# Patient Record
Sex: Female | Born: 1975 | Race: White | Hispanic: No | Marital: Married | Smoking: Never smoker
Health system: Southern US, Community
[De-identification: ages and names within clinical notes are randomized; demographics above are authoritative.]

## PROBLEM LIST (undated history)

## (undated) DIAGNOSIS — N1330 Unspecified hydronephrosis: Secondary | ICD-10-CM

## (undated) DIAGNOSIS — Q628 Other congenital malformations of ureter: Secondary | ICD-10-CM

## (undated) DIAGNOSIS — G459 Transient cerebral ischemic attack, unspecified: Secondary | ICD-10-CM

## (undated) HISTORY — PX: CHOLECYSTECTOMY: SHX55

## (undated) HISTORY — PX: TONSILLECTOMY: SUR1361

## (undated) HISTORY — PX: ABDOMINAL HYSTERECTOMY: SHX81

---

## 2013-06-15 DIAGNOSIS — G43909 Migraine, unspecified, not intractable, without status migrainosus: Secondary | ICD-10-CM | POA: Insufficient documentation

## 2013-06-15 DIAGNOSIS — F319 Bipolar disorder, unspecified: Secondary | ICD-10-CM | POA: Insufficient documentation

## 2013-10-06 DIAGNOSIS — E039 Hypothyroidism, unspecified: Secondary | ICD-10-CM | POA: Insufficient documentation

## 2015-02-16 DIAGNOSIS — Q628 Other congenital malformations of ureter: Secondary | ICD-10-CM | POA: Insufficient documentation

## 2015-08-07 ENCOUNTER — Encounter (HOSPITAL_BASED_OUTPATIENT_CLINIC_OR_DEPARTMENT_OTHER): Payer: Self-pay | Admitting: Emergency Medicine

## 2015-08-07 ENCOUNTER — Emergency Department (HOSPITAL_BASED_OUTPATIENT_CLINIC_OR_DEPARTMENT_OTHER)
Admission: EM | Admit: 2015-08-07 | Discharge: 2015-08-07 | Disposition: A | Discharge status: Home / Self Care | Payer: Self-pay | Attending: Physician Assistant | Admitting: Physician Assistant

## 2015-08-07 DIAGNOSIS — Z79899 Other long term (current) drug therapy: Secondary | ICD-10-CM | POA: Insufficient documentation

## 2015-08-07 DIAGNOSIS — M62838 Other muscle spasm: Secondary | ICD-10-CM | POA: Insufficient documentation

## 2015-08-07 MED ORDER — IBUPROFEN 800 MG PO TABS: 800.0000 mg | ORAL_TABLET | Freq: Three times a day (TID) | ORAL | Status: AC

## 2015-08-07 MED ORDER — METHOCARBAMOL 500 MG PO TABS: 500.0000 mg | ORAL_TABLET | Freq: Two times a day (BID) | ORAL | Status: DC

## 2015-08-07 NOTE — Discharge Instructions (Signed)
Take the prescribed medication as directed.  May continue to use heat therapy (heating pad, warm showers, hot bath, etc). Follow-up with your primary care doctor. Return to the ED for new or worsening symptoms.

## 2015-08-07 NOTE — ED Provider Notes (Signed)
CSN: [id number]     Arrival date & time 08/07/15  1939 History   First MD Initiated Contact with Patient 08/07/15 2210     Chief Complaint  Patient presents with  . Neck Pain     (Consider location/radiation/quality/duration/timing/severity/associated sxs/prior Treatment) Patient is a 40 y.o. female presenting with neck pain. The history is provided by the patient and medical records.  Neck Pain   40 year old female with no significant past medical history presenting to the ED for neck pain. Patient states she recently flew back from [place] on Friday. She states she sat in the middle seat and the next day after returning home she began to experience pain to her posterior neck and left shoulder. Patient reports this pain is a constant ache. She states sometimes when picking up things or pushing on something with her arms she has a sharp pain in the left side of her neck. She states she sat in the hot tub for while to try to alleviate soreness and she is also been taking Motrin but has not had any relief. She denies any numbness or weakness of her upper extremities. She denies any headache or dizziness. No prior neck injuries or surgeries.  History reviewed. No pertinent past medical history. Past Surgical History  Procedure Laterality Date  . Abdominal hysterectomy    . Tonsillectomy     History reviewed. No pertinent family history. Social History  Substance Use Topics  . Smoking status: Never Smoker   . Smokeless tobacco: None  . Alcohol Use: No   OB History    No data available     Review of Systems  Musculoskeletal: Positive for neck pain.  All other systems reviewed and are negative.     Allergies  Review of patient's allergies indicates not on file.  Home Medications   Prior to Admission medications   Medication Sig Start Date End Date Taking? Authorizing Provider  lamoTRIgine (LAMICTAL) 200 MG tablet Take 200 mg by mouth daily.   Yes Historical Provider, MD    norethindrone-ethinyl estradiol-iron (ESTROSTEP FE,TILIA FE,TRI-LEGEST FE) 1-20/1-30/1-35 MG-MCG tablet Take 1 tablet by mouth daily.   Yes Historical Provider, MD   BP 109/70 mmHg  Pulse 67  Temp(Src) 98.3 F (36.8 C) (Oral)  Resp 18  Ht 5\' 7"  (1.702 m)  Wt 74.844 kg  BMI 25.84 kg/m2  SpO2 100%   Physical Exam  Constitutional: She is oriented to person, place, and time. She appears well-developed and well-nourished. No distress.  HENT:  Head: Normocephalic and atraumatic.  Mouth/Throat: Oropharynx is clear and moist.  Eyes: Conjunctivae and EOM are normal. Pupils are equal, round, and reactive to light.  Neck: Normal range of motion. Neck supple.  Cardiovascular: Normal rate, regular rhythm and normal heart sounds.   Pulmonary/Chest: Effort normal and breath sounds normal. No respiratory distress. She has no wheezes.  Musculoskeletal: Normal range of motion. She exhibits no edema.       Cervical back: She exhibits tenderness and pain.       Back:  Tenderness of left cervical paraspinal region, no midline tenderness or step-off, full range of motion maintained, normal strength and sensation of bilateral upper extremities  Neurological: She is alert and oriented to person, place, and time.  AAOx3, answering questions appropriately; equal strength UE and LE bilaterally; CN grossly intact; moves all extremities appropriately without ataxia; no focal neuro deficits or facial asymmetry appreciated  Skin: Skin is warm and dry. She is not diaphoretic.  Psychiatric: She has  a normal mood and affect.  Nursing note and vitals reviewed.   ED Course  Procedures (including critical care time) Labs Review Labs Reviewed - No data to display  Imaging Review No results found. I have personally reviewed and evaluated these images and lab results as part of my medical decision-making.   EKG Interpretation None      MDM   Final diagnoses:  Muscle spasms of neck   40 year old female  here with sided neck pain after flying back from [place] last week. Patient afebrile, nontoxic in appearance. She denies any known injury or trauma. On exam she has tenderness of her left cervical paraspinal region without midline tenderness or step-off. Chest full range of motion of her neck. Normal strength and sensation of bilateral upper extremities.  No focal neurologic deficits to suggest central cord syndrome at this time. Patient likely with muscle spasms.  Will start on robaxin, continue motrin.  May also continue heat therapy at home.  Discussed plan with patient, he/she acknowledged understanding and agreed with plan of care.  Return precautions given for new or worsening symptoms.  Garlon Hatchet, PA-C 08/07/15 2252  Courteney Randall An, MD 08/09/15 [phone]

## 2015-08-07 NOTE — ED Notes (Signed)
Patient states that she just flew back from [place] on Friday, Since she has had pain to her neck, and shoulders. The patient reports that it like a constant ache that she can not get to go away and reports that it is annoying

## 2015-08-15 ENCOUNTER — Encounter (HOSPITAL_BASED_OUTPATIENT_CLINIC_OR_DEPARTMENT_OTHER): Payer: Self-pay | Admitting: Emergency Medicine

## 2015-08-15 DIAGNOSIS — M542 Cervicalgia: Secondary | ICD-10-CM | POA: Diagnosis present

## 2015-08-15 DIAGNOSIS — Z793 Long term (current) use of hormonal contraceptives: Secondary | ICD-10-CM | POA: Insufficient documentation

## 2015-08-15 DIAGNOSIS — Z791 Long term (current) use of non-steroidal anti-inflammatories (NSAID): Secondary | ICD-10-CM | POA: Diagnosis not present

## 2015-08-15 DIAGNOSIS — M5412 Radiculopathy, cervical region: Secondary | ICD-10-CM | POA: Insufficient documentation

## 2015-08-15 DIAGNOSIS — Z79899 Other long term (current) drug therapy: Secondary | ICD-10-CM | POA: Diagnosis not present

## 2015-08-15 NOTE — ED Notes (Signed)
Patient was here on the 21st with neck pain. The patient reports that none of the medications that she was given has helped and now her left 2 fingers are going numb

## 2015-08-16 ENCOUNTER — Emergency Department (HOSPITAL_BASED_OUTPATIENT_CLINIC_OR_DEPARTMENT_OTHER)
Admission: EM | Admit: 2015-08-16 | Discharge: 2015-08-16 | Disposition: A | Discharge status: Home / Self Care | Payer: Medicaid - Out of State | Attending: Emergency Medicine | Admitting: Emergency Medicine

## 2015-08-16 DIAGNOSIS — M5412 Radiculopathy, cervical region: Secondary | ICD-10-CM

## 2015-08-16 MED ORDER — PREDNISONE 20 MG PO TABS: ORAL_TABLET | ORAL | Status: DC

## 2015-08-16 MED ORDER — HYDROCODONE-ACETAMINOPHEN 5-325 MG PO TABS: 1.0000 | ORAL_TABLET | ORAL | Status: DC | PRN

## 2015-08-16 MED ORDER — DEXAMETHASONE SODIUM PHOSPHATE 10 MG/ML IJ SOLN
10.0000 mg | Freq: Once | INTRAMUSCULAR | Status: AC
  Administered 2015-08-16: 10 mg via INTRAMUSCULAR
  Filled 2015-08-16: qty 1

## 2015-08-16 NOTE — ED Provider Notes (Signed)
CSN: [id number]     Arrival date & time 08/15/15  2325 History   First MD Initiated Contact with Patient 08/16/15 0044     Chief Complaint  Patient presents with  . Neck Pain     (Consider location/radiation/quality/duration/timing/severity/associated sxs/prior Treatment) HPI Comments: Presents with continued neck pain. Patient seen in the ER previously and treated with ibuprofen and muscle relaxer. Patient reports that pain is continuing and now worsening. Pain is on the left side of her neck and now radiates down the left arm and she has had tingling in her third fourth and fifth fingers. No loss of strength or sensation. She denies any injury.  Patient is a 40 y.o. female presenting with neck pain.  Neck Pain Associated symptoms: numbness     History reviewed. No pertinent past medical history. Past Surgical History  Procedure Laterality Date  . Abdominal hysterectomy    . Tonsillectomy     History reviewed. No pertinent family history. Social History  Substance Use Topics  . Smoking status: Never Smoker   . Smokeless tobacco: None  . Alcohol Use: No   OB History    No data available     Review of Systems  Musculoskeletal: Positive for neck pain.  Neurological: Positive for numbness.  All other systems reviewed and are negative.     Allergies  Review of patient's allergies indicates no known allergies.  Home Medications   Prior to Admission medications   Medication Sig Start Date End Date Taking? Authorizing Provider  HYDROcodone-acetaminophen (NORCO/VICODIN) 5-325 MG tablet Take 1 tablet by mouth every 4 (four) hours as needed for moderate pain. 08/16/15   Gilda Crease, MD  ibuprofen (ADVIL,MOTRIN) 800 MG tablet Take 1 tablet (800 mg total) by mouth 3 (three) times daily. 08/07/15   Garlon Hatchet, PA-C  lamoTRIgine (LAMICTAL) 200 MG tablet Take 200 mg by mouth daily.    Historical Provider, MD  methocarbamol (ROBAXIN) 500 MG tablet Take 1 tablet (500 mg  total) by mouth 2 (two) times daily. 08/07/15   Garlon Hatchet, PA-C  norethindrone-ethinyl estradiol-iron (ESTROSTEP FE,TILIA FE,TRI-LEGEST FE) 1-20/1-30/1-35 MG-MCG tablet Take 1 tablet by mouth daily.    Historical Provider, MD  predniSONE (DELTASONE) 20 MG tablet 3 tabs po daily x 3 days, then 2 tabs x 3 days, then 1.5 tabs x 3 days, then 1 tab x 3 days, then 0.5 tabs x 3 days 08/16/15   Gilda Crease, MD   BP 110/71 mmHg  Pulse 68  Temp(Src) 98.5 F (36.9 C) (Oral)  Resp 18  Ht  (1.702 m)  Wt 165 lb (74.844 kg)  BMI 25.84 kg/m2  SpO2 100% Physical Exam  Constitutional: She is oriented to person, place, and time. She appears well-developed and well-nourished. No distress.  HENT:  Head: Normocephalic and atraumatic.  Right Ear: Hearing normal.  Left Ear: Hearing normal.  Nose: Nose normal.  Mouth/Throat: Oropharynx is clear and moist and mucous membranes are normal.  Eyes: Conjunctivae and EOM are normal. Pupils are equal, round, and reactive to light.  Neck: Normal range of motion. Neck supple. Muscular tenderness (Left side) present.  Cardiovascular: Regular rhythm, S1 normal and S2 normal.  Exam reveals no gallop and no friction rub.   No murmur heard. Pulmonary/Chest: Effort normal and breath sounds normal. No respiratory distress. She exhibits no tenderness.  Abdominal: Soft. Normal appearance and bowel sounds are normal. There is no hepatosplenomegaly. There is no tenderness. There is no rebound, no guarding,  no tenderness at McBurney's point and negative Murphy's sign. No hernia.  Musculoskeletal: Normal range of motion.  Neurological: She is alert and oriented to person, place, and time. She has normal strength. No cranial nerve deficit or sensory deficit. Coordination normal. GCS eye subscore is 4. GCS verbal subscore is 5. GCS motor subscore is 6.  Normal shoulder strength bilaterally Normal flexion and extension strength at elbows Normal flexion and extension  at wrist and fingers Normal abduction, abduction and opposition of all fingers  Normal strength and sensation upper extremities  Skin: Skin is warm, dry and intact. No rash noted. No cyanosis.  Psychiatric: She has a normal mood and affect. Her speech is normal and behavior is normal. Thought content normal.  Nursing note and vitals reviewed.   ED Course  Procedures (including critical care time) Labs Review Labs Reviewed - No data to display  Imaging Review No results found. I have personally reviewed and evaluated these images and lab results as part of my medical decision-making.   EKG Interpretation None      MDM   Final diagnoses:  Cervical radiculopathy    She presents with evidence of cervical radiculopathy but no neurologic deficit. She is in the area visiting. Will treat with prednisone and analgesia, follow up with primary doctor when she returns home. She does not have any deficit that would require more urgent MRI or surgical intervention.    Gilda Creasehristopher J Kalab Camps, MD 08/16/15 [id number]

## 2015-08-16 NOTE — Discharge Instructions (Signed)

## 2015-09-09 ENCOUNTER — Emergency Department (HOSPITAL_BASED_OUTPATIENT_CLINIC_OR_DEPARTMENT_OTHER)
Admission: EM | Admit: 2015-09-09 | Discharge: 2015-09-09 | Disposition: A | Discharge status: Home / Self Care | Payer: Medicaid - Out of State | Attending: Emergency Medicine | Admitting: Emergency Medicine

## 2015-09-09 ENCOUNTER — Encounter (HOSPITAL_BASED_OUTPATIENT_CLINIC_OR_DEPARTMENT_OTHER): Payer: Self-pay | Admitting: *Deleted

## 2015-09-09 DIAGNOSIS — R200 Anesthesia of skin: Secondary | ICD-10-CM | POA: Diagnosis not present

## 2015-09-09 DIAGNOSIS — Z791 Long term (current) use of non-steroidal anti-inflammatories (NSAID): Secondary | ICD-10-CM | POA: Insufficient documentation

## 2015-09-09 DIAGNOSIS — M5412 Radiculopathy, cervical region: Secondary | ICD-10-CM

## 2015-09-09 DIAGNOSIS — M542 Cervicalgia: Secondary | ICD-10-CM | POA: Diagnosis present

## 2015-09-09 DIAGNOSIS — Z79818 Long term (current) use of other agents affecting estrogen receptors and estrogen levels: Secondary | ICD-10-CM | POA: Diagnosis not present

## 2015-09-09 DIAGNOSIS — R202 Paresthesia of skin: Secondary | ICD-10-CM | POA: Diagnosis not present

## 2015-09-09 MED ORDER — HYDROCODONE-ACETAMINOPHEN 5-325 MG PO TABS: 1.0000 | ORAL_TABLET | ORAL | Status: DC | PRN

## 2015-09-09 NOTE — ED Notes (Signed)
MD at bedside. 

## 2015-09-09 NOTE — ED Provider Notes (Signed)
CSN: [id number]     Arrival date & time 09/09/15  1030 History   First MD Initiated Contact with Patient 09/09/15 1050     Chief Complaint  Patient presents with  . Neck Pain      Patient is a 40 y.o. female presenting with neck pain. The history is provided by the patient.  Neck Pain Associated symptoms: numbness   Associated symptoms: no chest pain   Patient presents with neck pain radiating down to her left hand. She's had over the last month. States she didn't really have much neck pain before that but did have a bad accident when she was 14 and had some neck pain at that time. States she had a previous injection in the neck that was a long time ago. States that there is no new trauma. She's been seen in the ER twice. She states she had steroids which made her feel sick and didn't really help. The pain is in the left neck and goes down the left hand. No headache. No Confusion. She is from [place] but is here for work. She states she'll be here another 3 or 4 months. Patient has gone in a hot tub and then cooled off and felt with the ice helped more than the heat.  History reviewed. No pertinent past medical history. Past Surgical History  Procedure Laterality Date  . Abdominal hysterectomy    . Tonsillectomy    . Cholecystectomy     No family history on file. Social History  Substance Use Topics  . Smoking status: Never Smoker   . Smokeless tobacco: None  . Alcohol Use: No   OB History    No data available     Review of Systems  Constitutional: Negative for appetite change.  Respiratory: Negative for shortness of breath.   Cardiovascular: Negative for chest pain.  Gastrointestinal: Negative for abdominal pain.  Genitourinary: Negative for dyspareunia.  Musculoskeletal: Positive for neck pain.  Skin: Negative for wound.  Neurological: Positive for numbness.      Allergies  Review of patient's allergies indicates no known allergies.  Home Medications   Prior to  Admission medications   Medication Sig Start Date End Date Taking? Authorizing Provider  HYDROcodone-acetaminophen (NORCO/VICODIN) 5-325 MG tablet Take 1 tablet by mouth every 4 (four) hours as needed for moderate pain. 09/09/15   Benjiman CoreNathan Jailene Cupit, MD  ibuprofen (ADVIL,MOTRIN) 800 MG tablet Take 1 tablet (800 mg total) by mouth 3 (three) times daily. 08/07/15   Garlon HatchetLisa M Sanders, PA-C  lamoTRIgine (LAMICTAL) 200 MG tablet Take 200 mg by mouth daily.    Historical Provider, MD  methocarbamol (ROBAXIN) 500 MG tablet Take 1 tablet (500 mg total) by mouth 2 (two) times daily. 08/07/15   Garlon HatchetLisa M Sanders, PA-C  norethindrone-ethinyl estradiol-iron (ESTROSTEP FE,TILIA FE,TRI-LEGEST FE) 1-20/1-30/1-35 MG-MCG tablet Take 1 tablet by mouth daily.    Historical Provider, MD  predniSONE (DELTASONE) 20 MG tablet 3 tabs po daily x 3 days, then 2 tabs x 3 days, then 1.5 tabs x 3 days, then 1 tab x 3 days, then 0.5 tabs x 3 days 08/16/15   Gilda Creasehristopher J Pollina, MD   BP 112/62 mmHg  Pulse 78  Temp(Src) 97.8 F (36.6 C) (Oral)  Resp 18  Ht 5\' 7"  (1.702 m)  Wt 165 lb (74.844 kg)  BMI 25.84 kg/m2  SpO2 100% Physical Exam  Constitutional: She appears well-developed.  HENT:  Head: Atraumatic.  Neck: Neck supple.  Overall good range of motion  with rotation on neck. Mild tenderness to left paraspinal musculature.  Cardiovascular: Normal rate.   Pulmonary/Chest: Effort normal.  Neurological:  Slight paresthesias to left hand. Good flexion-extension fingers and wrist. Strong radial pulse in bilateral wrist.  Skin: Skin is warm.    ED Course  Procedures (including critical care time) Labs Review Labs Reviewed - No data to display  Imaging Review No results found. I have personally reviewed and evaluated these images and lab results as part of my medical decision-making.   EKG Interpretation None      MDM   Final diagnoses:  Cervical radicular pain    Patient with neck pain going down arm. Likely  cervical radiculopathy. Does not appear to need urgent MRI. Will give short course of assessment a follow-up with sports medicine.    Benjiman Core, MD 09/09/15 1116

## 2015-09-09 NOTE — Discharge Instructions (Signed)

## 2015-09-09 NOTE — ED Notes (Signed)
Patient states that her neck pain has returned and that she has been taking ibuprofen which provided some relief. Has been using ice and heat on neck.

## 2015-09-09 NOTE — ED Notes (Signed)
approx one month had onset neck pain, rec medical care here, states had relief with steroids, but now pain has returned, having constant neck pain, lower neck pain, radiates into both shoulders, feels stiff.

## 2015-09-13 ENCOUNTER — Encounter: Payer: Self-pay | Admitting: Family Medicine

## 2015-09-13 ENCOUNTER — Ambulatory Visit (INDEPENDENT_AMBULATORY_CARE_PROVIDER_SITE_OTHER): Payer: Medicaid - Out of State | Admitting: Family Medicine

## 2015-09-13 ENCOUNTER — Encounter (INDEPENDENT_AMBULATORY_CARE_PROVIDER_SITE_OTHER): Payer: Self-pay

## 2015-09-13 VITALS — BP 117/82 | HR 77 | Ht 67.0 in | Wt 165.0 lb

## 2015-09-13 DIAGNOSIS — M542 Cervicalgia: Secondary | ICD-10-CM

## 2015-09-13 MED ORDER — DICLOFENAC SODIUM 75 MG PO TBEC: 75.0000 mg | DELAYED_RELEASE_TABLET | Freq: Two times a day (BID) | ORAL | Status: DC

## 2015-09-13 MED ORDER — CYCLOBENZAPRINE HCL 10 MG PO TABS: 10.0000 mg | ORAL_TABLET | Freq: Three times a day (TID) | ORAL | Status: DC | PRN

## 2015-09-13 NOTE — Patient Instructions (Signed)
You have cervical radiculopathy (a pinched nerve in the neck). Voltaren twice a day with food for pain and inflammation. Flexeril three times a day as needed for muscle spasms (can make you sleepy - if so do not drive while taking this). Consider cervical collar if severely painful. Simple range of motion exercises within limits of pain to prevent further stiffness. Start physical therapy for stretching, exercises, traction, and modalities. Heat 15 minutes at a time 3-4 times a day to help with spasms. Watch head position when on computers, texting, when sleeping in bed - should in line with back to prevent further nerve traction and irritation. Consider home traction unit if you get benefit with this in physical therapy. If not improving we will consider an MRI. Follow up with me in 4-6 weeks but call me sooner if you want to try nortriptyline or do imaging if you're not improving.

## 2015-09-14 ENCOUNTER — Other Ambulatory Visit: Payer: Self-pay | Admitting: *Deleted

## 2015-09-14 DIAGNOSIS — M542 Cervicalgia: Secondary | ICD-10-CM | POA: Insufficient documentation

## 2015-09-14 MED ORDER — MELOXICAM 15 MG PO TABS: 15.0000 mg | ORAL_TABLET | Freq: Every day | ORAL | Status: DC

## 2015-09-14 NOTE — Assessment & Plan Note (Signed)
consistent with cervical radiculopathy.  Neuro exam normal except decreased biceps reflex on this side.  Is s/p prednisone dose pack and tried a muscle relaxant.  She will start physical therapy, home exercise program.  Try meloxicam (insurance would not cover voltaren) and flexeril as needed.  Heat for spasms.  Discussed ergonomic issues.  F/u in 4-6 weeks.  Consider nortriptyline, MRI if not improving.

## 2015-09-14 NOTE — Progress Notes (Signed)
PCP: No primary care provider on file.  Subjective:   HPI: Patient is a 40 y.o. female here for neck pain.  Patient denies acute injury. She does fly a lot for work. Reports about a month as she developed stiffness in neck that progressed to include tingling in left arm. Pain today is 2/10, still described as a stiffness mid to left side of neck. Radiates into elbow and pinky finger. No prior issues. No bowel/bladder dysfunction. Tried muscle relaxant and steroid dose pack for 6 days with benefit while she was on this.  No past medical history on file.  Current Outpatient Prescriptions on File Prior to Visit  Medication Sig Dispense Refill  . ibuprofen (ADVIL,MOTRIN) 800 MG tablet Take 1 tablet (800 mg total) by mouth 3 (three) times daily. 21 tablet 0  . lamoTRIgine (LAMICTAL) 200 MG tablet Take 200 mg by mouth daily.    . methocarbamol (ROBAXIN) 500 MG tablet Take 1 tablet (500 mg total) by mouth 2 (two) times daily. 20 tablet 0  . norethindrone-ethinyl estradiol-iron (ESTROSTEP FE,TILIA FE,TRI-LEGEST FE) 1-20/1-30/1-35 MG-MCG tablet Take 1 tablet by mouth daily.     No current facility-administered medications on file prior to visit.    Past Surgical History  Procedure Laterality Date  . Abdominal hysterectomy    . Tonsillectomy    . Cholecystectomy      No Known Allergies  Social History   Social History  . Marital Status: Married    Spouse Name: N/A  . Number of Children: N/A  . Years of Education: N/A   Occupational History  . Not on file.   Social History Main Topics  . Smoking status: Never Smoker   . Smokeless tobacco: Not on file  . Alcohol Use: No  . Drug Use: No  . Sexual Activity: Not on file   Other Topics Concern  . Not on file   Social History Narrative    No family history on file.  BP 117/82 mmHg  Pulse 77  Ht 5\' 7"  (1.702 m)  Wt 165 lb (74.844 kg)  BMI 25.84 kg/m2  Review of Systems: See HPI above.    Objective:  Physical  Exam:  Gen: NAD, comfortable in exam room  Neck: No gross deformity, swelling, bruising. TTP left cervical paraspinal region.  No midline/bony TTP. FROM neck - pain . BUE strength 5/5.   Sensation intact to light touch currently.   2+ equal reflexes in triceps, brachioradialis tendons.  1+ left bicep, 2+ right biceps reflex. Negative spurlings.  Left shoulder: FROM without pain.    Assessment & Plan:  1. Neck pain - consistent with cervical radiculopathy.  Neuro exam normal except decreased biceps reflex on this side.  Is s/p prednisone dose pack and tried a muscle relaxant.  She will start physical therapy, home exercise program.  Try meloxicam (insurance would not cover voltaren) and flexeril as needed.  Heat for spasms.  Discussed ergonomic issues.  F/u in 4-6 weeks.  Consider nortriptyline, MRI if not improving.

## 2016-07-24 ENCOUNTER — Encounter (HOSPITAL_BASED_OUTPATIENT_CLINIC_OR_DEPARTMENT_OTHER): Payer: Self-pay | Admitting: Emergency Medicine

## 2016-07-24 ENCOUNTER — Emergency Department (HOSPITAL_BASED_OUTPATIENT_CLINIC_OR_DEPARTMENT_OTHER)
Admission: EM | Admit: 2016-07-24 | Discharge: 2016-07-25 | Disposition: A | Discharge status: Home / Self Care | Payer: Medicaid Other | Attending: Emergency Medicine | Admitting: Emergency Medicine

## 2016-07-24 ENCOUNTER — Emergency Department (HOSPITAL_BASED_OUTPATIENT_CLINIC_OR_DEPARTMENT_OTHER): Payer: Medicaid Other

## 2016-07-24 DIAGNOSIS — K219 Gastro-esophageal reflux disease without esophagitis: Secondary | ICD-10-CM | POA: Diagnosis not present

## 2016-07-24 DIAGNOSIS — R51 Headache: Secondary | ICD-10-CM | POA: Insufficient documentation

## 2016-07-24 DIAGNOSIS — R0789 Other chest pain: Secondary | ICD-10-CM

## 2016-07-24 DIAGNOSIS — R519 Headache, unspecified: Secondary | ICD-10-CM

## 2016-07-24 DIAGNOSIS — E039 Hypothyroidism, unspecified: Secondary | ICD-10-CM | POA: Diagnosis not present

## 2016-07-24 LAB — CBC WITH DIFFERENTIAL/PLATELET
BASOS PCT: 1 %
Basophils Absolute: 0 10*3/uL (ref 0.0–0.1)
EOS ABS: 0.2 10*3/uL (ref 0.0–0.7)
Eosinophils Relative: 3 %
HCT: 42.7 % (ref 36.0–46.0)
Hemoglobin: 14.5 g/dL (ref 12.0–15.0)
Lymphocytes Relative: 38 %
Lymphs Abs: 2.1 10*3/uL (ref 0.7–4.0)
MCH: 29.7 pg (ref 26.0–34.0)
MCHC: 34 g/dL (ref 30.0–36.0)
MCV: 87.5 fL (ref 78.0–100.0)
MONO ABS: 0.5 10*3/uL (ref 0.1–1.0)
MONOS PCT: 9 %
Neutro Abs: 2.7 10*3/uL (ref 1.7–7.7)
Neutrophils Relative %: 49 %
PLATELETS: 190 10*3/uL (ref 150–400)
RBC: 4.88 MIL/uL (ref 3.87–5.11)
RDW: 12.1 % (ref 11.5–15.5)
WBC: 5.6 10*3/uL (ref 4.0–10.5)

## 2016-07-24 MED ORDER — SODIUM CHLORIDE 0.9 % IV BOLUS (SEPSIS)
1000.0000 mL | Freq: Once | INTRAVENOUS | Status: AC
  Administered 2016-07-24: 1000 mL via INTRAVENOUS

## 2016-07-24 NOTE — ED Provider Notes (Signed)
[facility] Provider Note   CSN: [id number] Arrival date & time: 07/24/16  2236  By signing my name below, I, Linna Darner, attest that this documentation has been prepared under the direction and in the presence of Felicie Morn, NP. Electronically Signed: Linna Darner, Scribe. 07/24/2016. 10:58 PM.  History   Chief Complaint Chief Complaint  Patient presents with  . Cough    The history is provided by the patient. No language interpreter was used.  Cough  This is a new problem. The current episode started more than 1 week ago. The problem occurs every few minutes. The problem has not changed since onset.The cough is non-productive. Maximum temperature: subjective. The fever has been present for 3 to 4 days. Associated symptoms include chest pain (chest pressure, resolved), chills, sweats and headaches (resolved). She has tried nothing for the symptoms.     HPI Comments: Cathy Barrera is a 41 y.o. female who presents to the Emergency Department complaining of a persistent cough for several weeks. She notes burning abdominal pain, nausea, subjective fever/chills, sweats, and intermittent constipation/diarrhea for several days. Pt reports she vomited once this morning but denies any other episodes of vomiting in the last few days. She notes she experienced lightheadedness, blurred vision, mild chest pressure, and a headache yesterday but notes these symptoms have resolved. No medications or treatments tried. She notes a pertinent PSHx of cholecystectomy. No h/o IBS or diverticulitis. Pt denies numbness/tingling, focal weakness, or any other associated symptoms. She lives in [place] and is visiting her husband in [place].  History reviewed. No pertinent past medical history.  Patient Active Problem List   Diagnosis Date Noted  . Neck pain 09/14/2015  . 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 Bipolar I disorder (HCC) 06/15/2013  . Headache,  migraine 06/15/2013    Past Surgical History:  Procedure Laterality Date  . ABDOMINAL HYSTERECTOMY    . CHOLECYSTECTOMY    . TONSILLECTOMY      OB History    No data available       Home Medications    Prior to Admission medications   Medication Sig Start Date End Date Taking? Authorizing Provider  cyclobenzaprine (FLEXERIL) 10 MG tablet Take 1 tablet (10 mg total) by mouth every 8 (eight) hours as needed for muscle spasms. 09/13/15 09/12/16  Lenda Kelp, MD  diclofenac (VOLTAREN) 75 MG EC tablet Take 1 tablet (75 mg total) by mouth 2 (two) times daily. 09/13/15   Lenda Kelp, MD  ibuprofen (ADVIL,MOTRIN) 800 MG tablet Take 1 tablet (800 mg total) by mouth 3 (three) times daily. 08/07/15   Garlon Hatchet, PA-C  lamoTRIgine (LAMICTAL) 200 MG tablet Take 200 mg by mouth daily.    Historical Provider, MD  meloxicam (MOBIC) 15 MG tablet Take 1 tablet (15 mg total) by mouth daily. 09/14/15   Lenda Kelp, MD  methocarbamol (ROBAXIN) 500 MG tablet Take 1 tablet (500 mg total) by mouth 2 (two) times daily. 08/07/15   Garlon Hatchet, PA-C  norethindrone-ethinyl estradiol-iron (ESTROSTEP FE,TILIA FE,TRI-LEGEST FE) 1-20/1-30/1-35 MG-MCG tablet Take 1 tablet by mouth daily.    Historical Provider, MD    Family History History reviewed. No pertinent family history.  Social History Social History  Substance Use Topics  . Smoking status: Never Smoker  . Smokeless tobacco: Never Used  . Alcohol use No     Allergies   Patient has no known allergies.   Review of Systems Review  of Systems  Constitutional: Positive for chills and fever.  Eyes: Positive for visual disturbance (resolved).  Respiratory: Positive for cough.   Cardiovascular: Positive for chest pain (chest pressure, resolved).  Gastrointestinal: Positive for abdominal pain, constipation, diarrhea, nausea and vomiting.  Neurological: Positive for light-headedness (resolved) and headaches (resolved). Negative for  weakness and numbness.  All other systems reviewed and are negative.   Physical Exam Updated Vital Signs BP 131/93 (BP Location: Left Arm)   Pulse 72   Temp 98.2 F (36.8 C) (Oral)   Ht 5\' 7"  (1.702 m)   Wt 155 lb (70.3 kg)   SpO2 100%   BMI 24.28 kg/m   Physical Exam  Constitutional: She is oriented to person, place, and time. She appears well-developed and well-nourished. No distress.  HENT:  Head: Normocephalic and atraumatic.  Eyes: Conjunctivae and EOM are normal.  Neck: Neck supple. No tracheal deviation present.  Cardiovascular: Normal rate.   Pulmonary/Chest: Effort normal. No respiratory distress.  Musculoskeletal: Normal range of motion.  Neurological: She is alert and oriented to person, place, and time.  Skin: Skin is warm and dry.  Psychiatric: She has a normal mood and affect. Her behavior is normal.  Nursing note and vitals reviewed.   ED Treatments / Results  Labs (all labs ordered are listed, but only abnormal results are displayed) Labs Reviewed  COMPREHENSIVE METABOLIC PANEL - Abnormal; Notable for the following:       Result Value   ALT 12 (*)    Anion gap 4 (*)    All other components within normal limits  CBC WITH DIFFERENTIAL/PLATELET  TROPONIN I    EKG  EKG Interpretation  Date/Time:  Thursday July 24 2016 22:42:25 EST Ventricular Rate:  69 PR Interval:  142 QRS Duration: 74 QT Interval:  418 QTC Calculation: 447 R Axis:   80 Text Interpretation:  Normal sinus rhythm Normal ECG No previous tracing Confirmed by KNOTT MD, DANIEL [id number]) on 07/24/2016 10:54:26 PM       Radiology Dg Chest 2 View  Result Date: 07/24/2016 CLINICAL DATA:  Chest tightness and nonproductive cough for 2 days. EXAM: CHEST  2 VIEW COMPARISON:  None. FINDINGS: Minimal linear scarring or atelectasis in the left base. The lungs are otherwise clear. The pulmonary vasculature is normal. There is no pleural effusion. Hilar, mediastinal and cardiac contours are normal.  IMPRESSION: Minimal linear scarring or atelectasis in the left base. Electronically Signed   By: Ellery Plunkaniel R Mitchell M.D.   On: 07/24/2016 23:28    Procedures Procedures (including critical care time)  DIAGNOSTIC STUDIES: Oxygen Saturation is 100% on RA, normal by my interpretation.    COORDINATION OF CARE: 11:04 PM Discussed treatment plan with pt at bedside and pt agreed to plan.  Medications Ordered in ED Medications - No data to display   Initial Impression / Assessment and Plan / ED Course  I have reviewed the triage vital signs and the nursing notes.  Pertinent labs & imaging results that were available during my care of the patient were reviewed by me and considered in my medical decision making (see chart for details).    Patient is to be discharged with recommendation to follow up with PCP in regards to today's hospital visit. Chest pain is not likely of cardiac or pulmonary etiology d/t presentation, perc negative, VSS, no tracheal deviation, no JVD or new murmur, RRR, breath sounds equal bilaterally, EKG without acute abnormalities, negative troponin, and negative CXR. Pt has been advised start a  PPI and return to the ED is CP becomes exertional, associated with diaphoresis or nausea, radiates to left jaw/arm, worsens or becomes concerning in any way. Pt appears reliable for follow up and is agreeable to discharge.   Case has been discussed with  Dr. Nicanor Alcon who agrees with the above plan to discharge.  HA is non concerning for [facility], ICH, Meningitis, or temporal arteritis. Pt is afebrile with no focal neuro deficits, nuchal rigidity, or change in vision. Pt is to follow up with PCP to discuss prophylactic medication. Pt verbalizes understanding and is agreeable with plan to dc.       Final Clinical Impressions(s) / ED Diagnoses   Final diagnoses:  Gastroesophageal reflux disease without esophagitis  Atypical chest pain  Acute nonintractable headache, unspecified headache  type    New Prescriptions New Prescriptions   FAMOTIDINE (PEPCID) 20 MG TABLET    Take 1 tablet (20 mg total) by mouth 2 (two) times daily.   NAPROXEN (NAPROSYN) 375 MG TABLET    Take 1 tablet (375 mg total) by mouth 2 (two) times daily.   I personally performed the services described in this documentation, which was scribed in my presence. The recorded information has been reviewed and is accurate.    Felicie Morn, NP 07/25/16 0041    Lyndal Pulley, MD 07/26/16 [phone]

## 2016-07-24 NOTE — ED Notes (Signed)
Patient transported to X-ray 

## 2016-07-24 NOTE — ED Triage Notes (Signed)
Patient states that she is having some chest tightness with cough and generalized aches for the last 2 days. The patient reports that she is having pain to her chest, neck, back head and epigastric region. The patient reports that she has has some N/V this am -

## 2016-07-25 LAB — COMPREHENSIVE METABOLIC PANEL
ALBUMIN: 4.2 g/dL (ref 3.5–5.0)
ALT: 12 U/L — ABNORMAL LOW (ref 14–54)
ANION GAP: 4 — AB (ref 5–15)
AST: 19 U/L (ref 15–41)
Alkaline Phosphatase: 61 U/L (ref 38–126)
BILIRUBIN TOTAL: 0.5 mg/dL (ref 0.3–1.2)
BUN: 16 mg/dL (ref 6–20)
CALCIUM: 9 mg/dL (ref 8.9–10.3)
CO2: 31 mmol/L (ref 22–32)
Chloride: 104 mmol/L (ref 101–111)
Creatinine, Ser: 0.99 mg/dL (ref 0.44–1.00)
GFR calc non Af Amer: >60 mL/min (ref >60)
GLUCOSE: 94 mg/dL (ref 65–99)
POTASSIUM: 3.5 mmol/L (ref 3.5–5.1)
Sodium: 139 mmol/L (ref 135–145)
TOTAL PROTEIN: 7.1 g/dL (ref 6.5–8.1)

## 2016-07-25 LAB — TROPONIN I

## 2016-07-25 MED ORDER — NAPROXEN 375 MG PO TABS: 375.0000 mg | ORAL_TABLET | Freq: Two times a day (BID) | ORAL | 0 refills | Status: AC

## 2016-07-25 MED ORDER — FAMOTIDINE 20 MG PO TABS: 20.0000 mg | ORAL_TABLET | Freq: Two times a day (BID) | ORAL | 0 refills | Status: AC

## 2016-07-25 MED ORDER — ACETAMINOPHEN 325 MG PO TABS
650.0000 mg | ORAL_TABLET | Freq: Once | ORAL | Status: AC
  Administered 2016-07-25: 650 mg via ORAL
  Filled 2016-07-25: qty 2

## 2016-09-12 ENCOUNTER — Encounter (HOSPITAL_BASED_OUTPATIENT_CLINIC_OR_DEPARTMENT_OTHER): Payer: Self-pay | Admitting: *Deleted

## 2016-09-12 DIAGNOSIS — Y92410 Unspecified street and highway as the place of occurrence of the external cause: Secondary | ICD-10-CM | POA: Insufficient documentation

## 2016-09-12 DIAGNOSIS — Y999 Unspecified external cause status: Secondary | ICD-10-CM | POA: Insufficient documentation

## 2016-09-12 DIAGNOSIS — Y939 Activity, unspecified: Secondary | ICD-10-CM | POA: Diagnosis not present

## 2016-09-12 DIAGNOSIS — S161XXA Strain of muscle, fascia and tendon at neck level, initial encounter: Secondary | ICD-10-CM | POA: Insufficient documentation

## 2016-09-12 DIAGNOSIS — S199XXA Unspecified injury of neck, initial encounter: Secondary | ICD-10-CM | POA: Diagnosis present

## 2016-09-12 DIAGNOSIS — E039 Hypothyroidism, unspecified: Secondary | ICD-10-CM | POA: Diagnosis not present
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
## 2016-09-13 ENCOUNTER — Encounter (HOSPITAL_BASED_OUTPATIENT_CLINIC_OR_DEPARTMENT_OTHER): Payer: Self-pay | Admitting: Emergency Medicine

## 2016-09-13 ENCOUNTER — Emergency Department (HOSPITAL_BASED_OUTPATIENT_CLINIC_OR_DEPARTMENT_OTHER)
Admission: EM | Admit: 2016-09-13 | Discharge: 2016-09-13 | Disposition: A | Discharge status: Home / Self Care | Payer: Medicaid Other | Attending: Emergency Medicine | Admitting: Emergency Medicine

## 2016-09-13 DIAGNOSIS — S161XXA Strain of muscle, fascia and tendon at neck level, initial encounter: Secondary | ICD-10-CM

## 2016-09-13 DIAGNOSIS — M62838 Other muscle spasm: Secondary | ICD-10-CM

## 2016-09-13 HISTORY — DX: Transient cerebral ischemic attack, unspecified: G45.9

## 2016-09-13 HISTORY — DX: Other congenital malformations of ureter: Q62.8

## 2016-09-13 HISTORY — DX: Unspecified hydronephrosis: N13.30

## 2016-09-13 MED ORDER — METHOCARBAMOL 500 MG PO TABS: 500.0000 mg | ORAL_TABLET | Freq: Two times a day (BID) | ORAL | 0 refills | Status: AC
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
## 2016-09-13 NOTE — Discharge Instructions (Signed)
You have neck pain, possibly from a cervical strain and/or pinched nerve.  ° °SEEK IMMEDIATE MEDICAL ATTENTION IF: °You develop difficulties swallowing or breathing.  °You have new or worse numbness, weakness, tingling, or movement problems in your arms or legs.  °You develop increasing pain which is uncontrolled with medications.  °You have change in bowel or bladder function, or other concerns. ° ° ° °

## 2016-09-13 NOTE — ED Notes (Signed)
Pt discharged to home NAD.  

## 2017-04-13 ENCOUNTER — Emergency Department (HOSPITAL_COMMUNITY): Payer: Self-pay | Admitting: Emergency Medicine

## 2018-01-14 ENCOUNTER — Other Ambulatory Visit (HOSPITAL_COMMUNITY): Payer: Self-pay | Admitting: NURSE PRACTITIONER

## 2018-01-14 DIAGNOSIS — R1011 Right upper quadrant pain: Secondary | ICD-10-CM

## 2018-01-14 DIAGNOSIS — R21 Rash and other nonspecific skin eruption: Secondary | ICD-10-CM

## 2018-01-14 DIAGNOSIS — R509 Fever, unspecified: Secondary | ICD-10-CM

## 2018-01-19 ENCOUNTER — Ambulatory Visit (INDEPENDENT_AMBULATORY_CARE_PROVIDER_SITE_OTHER): Payer: Self-pay | Admitting: Gastroenterology

## 2018-01-25 ENCOUNTER — Emergency Department
Admission: EM | Admit: 2018-01-25 | Discharge: 2018-01-25 | Disposition: A | Discharge status: Home / Self Care | Payer: 59 | Attending: EMERGENCY MEDICINE | Admitting: EMERGENCY MEDICINE

## 2018-01-25 ENCOUNTER — Other Ambulatory Visit: Payer: Self-pay

## 2018-01-25 ENCOUNTER — Emergency Department (HOSPITAL_COMMUNITY): Payer: 59

## 2018-01-25 ENCOUNTER — Emergency Department (HOSPITAL_COMMUNITY): Payer: 59 | Admitting: Radiology

## 2018-01-25 DIAGNOSIS — R1115 Cyclical vomiting syndrome unrelated to migraine: Secondary | ICD-10-CM

## 2018-01-25 DIAGNOSIS — G43A0 Cyclical vomiting, not intractable: Secondary | ICD-10-CM

## 2018-01-25 DIAGNOSIS — Z79899 Other long term (current) drug therapy: Secondary | ICD-10-CM | POA: Insufficient documentation

## 2018-01-25 DIAGNOSIS — K219 Gastro-esophageal reflux disease without esophagitis: Secondary | ICD-10-CM | POA: Insufficient documentation

## 2018-01-25 DIAGNOSIS — R109 Unspecified abdominal pain: Secondary | ICD-10-CM

## 2018-01-25 LAB — COMPREHENSIVE METABOLIC PANEL, NON-FASTING
ALBUMIN: 4.4 g/dL (ref 3.6–4.8)
ALKALINE PHOSPHATASE: 63 U/L (ref 38–126)
ANION GAP: 8 mmol/L
AST (SGOT): 18 U/L (ref 7–56)
BILIRUBIN TOTAL: 0.4 mg/dL (ref 0.2–1.3)
BUN/CREA RATIO: 17
BUN: 15 mg/dL (ref 7–18)
CALCIUM: 9.4 mg/dL (ref 8.5–10.3)
CHLORIDE: 102 mmol/L (ref 101–111)
CO2 TOTAL: 31 mmol/L (ref 22–31)
CREATININE: 0.9 mg/dL (ref 0.50–1.20)
ESTIMATED GFR: 68 mL/min/1.73mˆ2 (ref >=60)
POTASSIUM: 4 mmol/L (ref 3.6–5.0)
PROTEIN TOTAL: 7.2 g/dL (ref 6.2–8.0)
SODIUM: 141 mmol/L (ref 137–145)

## 2018-01-25 LAB — URINALYSIS, MACROSCOPIC
BILIRUBIN: NOT DETECTED mg/dL
BLOOD: NOT DETECTED mg/dL
GLUCOSE: NOT DETECTED mg/dL
KETONES: NOT DETECTED mg/dL
LEUKOCYTES: NOT DETECTED WBCs/uL
NITRITE: NOT DETECTED
PH: 6.5 (ref 5.0–8.0)
PROTEIN: NOT DETECTED mg/dL
SPECIFIC GRAVITY: 1.01 (ref 1.005–1.030)
UROBILINOGEN: 0.2 mg/dL

## 2018-01-25 LAB — CBC WITH DIFF
BASOPHIL #: 0 x10ˆ3/uL (ref 0.00–1.00)
BASOPHIL %: 1 % (ref 0–1)
EOSINOPHIL #: 0.1 10*3/uL (ref 0.00–0.40)
EOSINOPHIL %: 3 % (ref 1–5)
HCT: 43.5 % (ref 37.0–47.0)
HGB: 14.3 g/dL (ref 12.0–16.0)
LYMPHOCYTE #: 1.2 x10ˆ3/uL (ref 1.00–4.00)
LYMPHOCYTE %: 30 % (ref 25–40)
MCHC: 32.9 g/dL — ABNORMAL LOW (ref 33.0–37.0)
MCV: 90.8 fL (ref 80.0–99.0)
MONOCYTE #: 0.3 x10ˆ3/uL (ref 0.10–0.80)
MONOCYTE %: 8 % (ref 4–10)
NEUTROPHIL #: 2.4 x10ˆ3/uL (ref 1.80–7.00)
PLATELETS: 191 x10ˆ3/uL (ref 130–400)
RBC: 4.79 x10ˆ6/uL (ref 4.20–5.40)
RDW: 13.1 % (ref 11.5–14.5)
WBC: 4 x10ˆ3/uL — ABNORMAL LOW (ref 4.8–10.8)

## 2018-01-25 LAB — TROPONIN-I: TROPONIN I: <0.01 ng/mL (ref 0.00–0.03)

## 2018-01-25 LAB — ECG 12 LEAD
Atrial Rate: 60 {beats}/min
Calculated P Axis: 67 degrees
Calculated R Axis: 70 degrees
Calculated T Axis: 81 degrees

## 2018-01-25 LAB — LIPASE: LIPASE: 81 U/L (ref 23–203)

## 2018-01-25 MED ORDER — OMEPRAZOLE 20 MG CAPSULE,DELAYED RELEASE
20.0000 mg | DELAYED_RELEASE_CAPSULE | Freq: Every day | ORAL | 0 refills | Status: AC
Start: 2018-01-25 — End: ?

## 2018-01-25 MED ORDER — SODIUM CHLORIDE 0.9 % (FLUSH) INJECTION SYRINGE
5.0000 mL | INJECTION | Freq: Two times a day (BID) | INTRAMUSCULAR | Status: DC
Start: 2018-01-25 — End: 2018-01-25

## 2018-01-25 MED ORDER — SODIUM CHLORIDE 0.9 % (FLUSH) INJECTION SYRINGE
5.0000 mL | INJECTION | INTRAMUSCULAR | Status: DC | PRN
Start: 2018-01-25 — End: 2018-01-25

## 2018-01-25 MED ORDER — ONDANSETRON 8 MG DISINTEGRATING TABLET
8.00 mg | ORAL_TABLET | Freq: Three times a day (TID) | ORAL | 0 refills | Status: AC | PRN
Start: 2018-01-25 — End: ?

## 2018-01-25 MED ORDER — ONDANSETRON HCL (PF) 4 MG/2 ML INJECTION SOLUTION
4.0000 mg | INTRAMUSCULAR | Status: AC
Start: 2018-01-25 — End: 2018-01-25
  Administered 2018-01-25 (×2): 4 mg via INTRAVENOUS
  Filled 2018-01-25: qty 2

## 2018-01-25 NOTE — ED Triage Notes (Signed)
Right side abd pain with diarrhea x 1 week. Denies fever. Does complain of nausea but no vomiting.

## 2018-01-25 NOTE — ED Provider Notes (Signed)
Emergency Department  Provider Note  HPI - 01/25/2018      Name: Raven Henson  Age and Gender: 42 y.o. female  Attending: Dr. Dorris Fetch  APP: Kirke Shaggy PA-C    History provided by: Patient    HPI:  Raven Henson is a 42 y.o. female who presents to the ED today for abdominal pain. Pt states that she has been experiencing aching right upper abdominal pain that has been present for 1 month. She rates her pain 3/10 and states that it is constant. Patient has also had diarrhea for a month, but within the last week it has been consistent. She has experienced associated nausea, but she denies having a fever and vomiting.  Pt has no other associated symptoms or complaints at this time.     PCP: None    Location: Right upper abdomen  Quality: Aching  Onset: 1 month ago  Severity: 3/10  Timing: Constant  Context: See HPI  Modifying factors: None noted  Associated symptoms: + Diarrhea and nausea. - Vomiting and fever.    Review of Systems:  Constitutional: No fever, chills, weakness.  Skin: No rashes, lesions.  HENT: No head injury. No sore throat, ear pain, difficulty swallowing.  Eyes: No vision changes, redness, discharge.  Cardio: No chest pain, palpitations.   Respiratory: No cough, wheezing, SOB.  GI: + Abdominal pain, nausea, and diarrhea. No vomiting. No constipation.   GU: No dysuria, hematuria, polyuria.  MSK: No joint pain. No neck pain. No back pain.  Neuro: No headache. No loss of sensation, focal deficits, LOC.  All other systems reviewed and are negative, unless commented on in the HPI.      The below information was reviewed with the patient:     Current Medications:  Current Outpatient Medications   Medication Sig   . omeprazole (PRILOSEC) 20 mg Oral Capsule, Delayed Release(E.C.) Take 1 Cap (20 mg total) by mouth Once a day   . ondansetron (ZOFRAN ODT) 8 mg Oral Tablet, Rapid Dissolve 1 Tab (8 mg total) by Sublingual route Every 8 hours as needed for nausea/vomiting       Allergies:   No Known  Allergies    Past Medical History:  No past medical history on file.    Past Surgical History:  No past surgical history on file.    Social History:  Social History     Tobacco Use   . Smoking status: Not on file   Substance Use Topics   . Alcohol use: Not on file   . Drug use: Not on file     Social History     Substance and Sexual Activity   Drug Use Not on file       Family History:  No family history on file.    Old records were reviewed.    Objective:  Nursing notes were reviewed.    Filed Vitals:    01/25/18 0959 01/25/18 1001 01/25/18 1003 01/25/18 1154   BP:  (!) 125/55  122/62   Pulse: 74  65 64   Resp: 18  18 18    Temp: 36.8 C (98.2 F)      SpO2: 100%   100%       Physical Exam:  Nursing note and vitals reviewed.  Vital signs reviewed as above.     Constitutional: Pt is well-developed and well-nourished.   Head: Normocephalic and atraumatic.   Eyes: Conjunctivae are normal. Pupils are equal, round, and reactive to light. EOM  are intact.  Neck: Soft, supple, full range of motion.  Cardiovascular: RRR. No Murmurs/rubs/gallops. Distal pulses present and equal bilaterally.  Pulmonary/Chest: Normal BS BL with no distress. No audible wheezes or crackles are noted.  GI: + Upper epigastric tenderness. Negative Murphy's, Mcburney's, Rovsing's, obturator or heel tap signs. Abdomen is soft and nondistended. No rebound, guarding, or masses.  Back: Nontender. Normal range of motion.   Extremities: Normal range of motion. No deformities. Exhibits no edema and no tenderness.   Skin: Warm and dry. No rash or lesions.  Neurological: Alert and oriented X 3. Neurologically intact. No focal deficits noted.  Psychiatric: Patient has a normal mood and affect.       Plan:   Appropriate labs and/or imaging ordered. Medical Records reviewed.    Work-up:  Orders Placed This Encounter   . XR ABD FLAT AND UPRIGHT SERIES (W PA CHEST)   . CANCELED: HCG, URINE QUALITATIVE, PREGNANCY   . CBC/DIFF   . COMPREHENSIVE METABOLIC PANEL,  NON-FASTING   . LIPASE   . URINALYSIS, MACROSCOPIC   . TROPONIN-I   . CBC WITH DIFF   . ECG 12 LEAD- FOR SCREENING   . INSERT & MAINTAIN PERIPHERAL IV ACCESS   . AND Linked Order Group    . NS flush syringe    . NS flush syringe   . ondansetron (ZOFRAN) 2 mg/mL injection   . ondansetron (ZOFRAN ODT) 8 mg Oral Tablet, Rapid Dissolve   . omeprazole (PRILOSEC) 20 mg Oral Capsule, Delayed Release(E.C.)        Labs:  Results for orders placed or performed during the hospital encounter of 01/25/18 (from the past 24 hour(s))   CBC/DIFF    Narrative    The following orders were created for panel order CBC/DIFF.  Procedure                               Abnormality         Status                     ---------                               -----------         ------                     CBC WITH ZOXW[[id number]]                Abnormal            Final result                 Please view results for these tests on the individual orders.   COMPREHENSIVE METABOLIC PANEL, NON-FASTING   Result Value Ref Range    SODIUM 141 137 - 145 mmol/L    POTASSIUM 4.0 3.6 - 5.0 mmol/L    CHLORIDE 102 101 - 111 mmol/L    CO2 TOTAL 31 22 - 31 mmol/L    ANION GAP 8 mmol/L    BUN 15 7 - 18 mg/dL    CREATININE 1.19 1.47 - 1.20 mg/dL    BUN/CREA RATIO 17     ESTIMATED GFR 68 >=60 mL/min/1.54m^2    ALBUMIN 4.4 3.6 - 4.8 g/dL    CALCIUM 9.4 8.5 - 82.9 mg/dL    GLUCOSE 94 68 -  99 mg/dL    ALKALINE PHOSPHATASE 63 38 - 126 U/L    ALT (SGPT) 13 3 - 45 U/L    AST (SGOT) 18 7 - 56 U/L    BILIRUBIN TOTAL 0.4 0.2 - 1.3 mg/dL    PROTEIN TOTAL 7.2 6.2 - 8.0 g/dL   LIPASE   Result Value Ref Range    LIPASE 81 23 - 203 U/L   URINALYSIS, MACROSCOPIC   Result Value Ref Range    COLOR Yellow Colorless, Straw, Yellow    APPEARANCE Clear Clear    SPECIFIC GRAVITY 1.010 >1.005-<1.030    PH 6.5 >5.0-<8.0    PROTEIN Not Detected Not Detected mg/dL    GLUCOSE Not Detected Not Detected mg/dL    KETONES Not Detected Not Detected mg/dL    UROBILINOGEN 0.2  Not Detected, 0.2   mg/dL    BILIRUBIN Not Detected Not Detected mg/dL    BLOOD Not Detected Not Detected mg/dL    NITRITE Not Detected Not Detected    LEUKOCYTES Not Detected Not Detected WBCs/uL   TROPONIN-I   Result Value Ref Range    TROPONIN I <0.01 0.00 - 0.03 ng/mL   CBC WITH DIFF   Result Value Ref Range    WBC 4.0 (L) 4.8 - 10.8 x10^3/uL    RBC 4.79 4.20 - 5.40 x10^6/uL    HGB 14.3 12.0 - 16.0 g/dL    HCT 59.4 58.5 - 92.9 %    MCV 90.8 80.0 - 99.0 fL    MCH 29.9 27.0 - 31.0 pg    MCHC 32.9 (L) 33.0 - 37.0 g/dL    RDW 24.4 62.8 - 63.8 %    PLATELETS 191 130 - 400 x10^3/uL    NEUTROPHIL % 58 50 - 73 %    LYMPHOCYTE % 30 25 - 40 %    MONOCYTE % 8 4 - 10 %    EOSINOPHIL % 3 1 - 5 %    BASOPHIL % 1 0 - 1 %    NEUTROPHIL # 2.40 1.80 - 7.00 x10^3/uL    LYMPHOCYTE # 1.20 1.00 - 4.00 x10^3/uL    MONOCYTE # 0.30 0.10 - 0.80 x10^3/uL    EOSINOPHIL # 0.10 0.00 - 0.40 x10^3/uL    BASOPHIL # 0.00 0.00 - 1.00 x10^3/uL       Abnormal Lab results:  Labs Reviewed   CBC WITH DIFF - Abnormal; Notable for the following components:       Result Value    WBC 4.0 (*)     MCHC 32.9 (*)     All other components within normal limits   LIPASE - Normal   URINALYSIS, MACROSCOPIC - Normal   TROPONIN-I - Normal   CBC/DIFF    Narrative:     The following orders were created for panel order CBC/DIFF.  Procedure                               Abnormality         Status                     ---------                               -----------         ------  CBC WITH ZOXW[[id number]]                Abnormal            Final result                 Please view results for these tests on the individual orders.   COMPREHENSIVE METABOLIC PANEL, NON-FASTING       Imaging:   Results for orders placed or performed during the hospital encounter of 01/25/18 (from the past 72 hour(s))   XR ABD FLAT AND UPRIGHT SERIES (W PA CHEST)     Status: None    Narrative    Acute abdomen series (supine and upright views the abdomen and PA view the  chest).  HISTORY:  Right upper quadrant pain and nausea. Previous cholecystectomy.      Impression    1. The bowel gas pattern is normal and no free intraperitoneal gas is seen.  2. A 2 mm calcification projects over the right kidney (suspected renal  calculus). No calcification is seen along the course of either ureter  although the lower abdomen and pelvis are considerably obscured by the  bowel gas.  3. There are clips from cholecystectomy.  4. Lungs are well-expanded and clear.        Radiologist location ID: [id number]         ECG:    ECG performed on 01/25/2018 at 10:44:56.   ECG was reviewed, and the interpretation is as follows - Normal Sinus Rhythm. Rate: 60 bpm. Normal ECG. No STEMI.   Date/Time ECG Read: [phone] 10:48       MDM:    During the patient's stay in the emergency department, the above listed imaging and/or labs were performed to assist with medical decision making and were reviewed by myself as available for review.    Patient rechecked and remained stable throughout the emergency department course.    Results discussed with patient.    All questions/concerns addressed, and patient agrees with disposition plan.    Patient is alert, afebrile, ambulatory, and non-toxic with a non-surgical abdomen upon re-examination prior to discharge.     Impression:   Encounter Diagnoses   Name Primary?   . Gastroesophageal reflux disease, esophagitis presence not specified Yes   . Cyclic vomiting syndrome      Disposition:   Discharged   Discussed with patient all lab and/or imaging results, diagnosis, treatment, and need for follow up.    Medication instructions were discussed with the patient.   It was advised that the patient return to the ED with any new, concerning, or worsening symptoms and follow up as directed.    The patient verbalized understanding of all instructions and had no further questions or concerns.     Follow up:   Katy Apo, MD  [address]  [address]  [place]  [address]  [phone]    Schedule an appointment as soon as possible for a visit       Prescriptions:  Discharge Medication List as of 01/25/2018 11:44 AM      START taking these medications    Details   omeprazole (PRILOSEC) 20 mg Oral Capsule, Delayed Release(E.C.) Take 1 Cap (20 mg total) by mouth Once a day, Disp-30 Cap, R-0, Print      ondansetron (ZOFRAN ODT) 8 mg Oral Tablet, Rapid Dissolve 1 Tab (8 mg total) by Sublingual route Every 8 hours as needed for nausea/vomiting, Disp-6 Tab, R-0, Print  I am scribing for, and in the presence of, Scott Lakeesha Fontanilla PA-C for services provided on 01/25/2018.  Jamal Collin, SCRIBE   [place], SCRIBE  01/25/2018, 11:28    I am scribing for, and in the presence of, Scott Elton Sin, [place] for services provided on 01/25/2018.  Harrel Lemon, SCRIBE   Lauren Chaplin, SCRIBE  01/25/2018, 12:17    I personally performed the services described in this documentation, as scribed  in my presence, and it is both accurate  and complete.  Stormy Card, PA-C  The co-signing faculty was physically present in the emergency department and available for consultation and did not particpate in the care of this patient.  William Hamburger Kalmen Lollar, PA-C  03/10/2018, 12:15

## 2018-01-25 NOTE — Discharge Instructions (Signed)
Please return if worsening.  Please make follow up appointment.

## 2018-01-29 ENCOUNTER — Ambulatory Visit (HOSPITAL_BASED_OUTPATIENT_CLINIC_OR_DEPARTMENT_OTHER): Payer: 59

## 2018-02-12 ENCOUNTER — Ambulatory Visit (HOSPITAL_BASED_OUTPATIENT_CLINIC_OR_DEPARTMENT_OTHER): Payer: 59

## 2018-02-12 ENCOUNTER — Encounter (HOSPITAL_BASED_OUTPATIENT_CLINIC_OR_DEPARTMENT_OTHER): Payer: Self-pay

## 2018-04-08 ENCOUNTER — Ambulatory Visit (INDEPENDENT_AMBULATORY_CARE_PROVIDER_SITE_OTHER): Payer: Self-pay | Admitting: Gastroenterology

## 2018-08-16 IMAGING — DX DG CHEST 2V
2 series · 2 of 2 positions shown · non-contrast
Comparison: None.

CLINICAL DATA: Chest tightness and nonproductive cough for 2 days.

EXAM:
CHEST  2 VIEW

[chest pa]
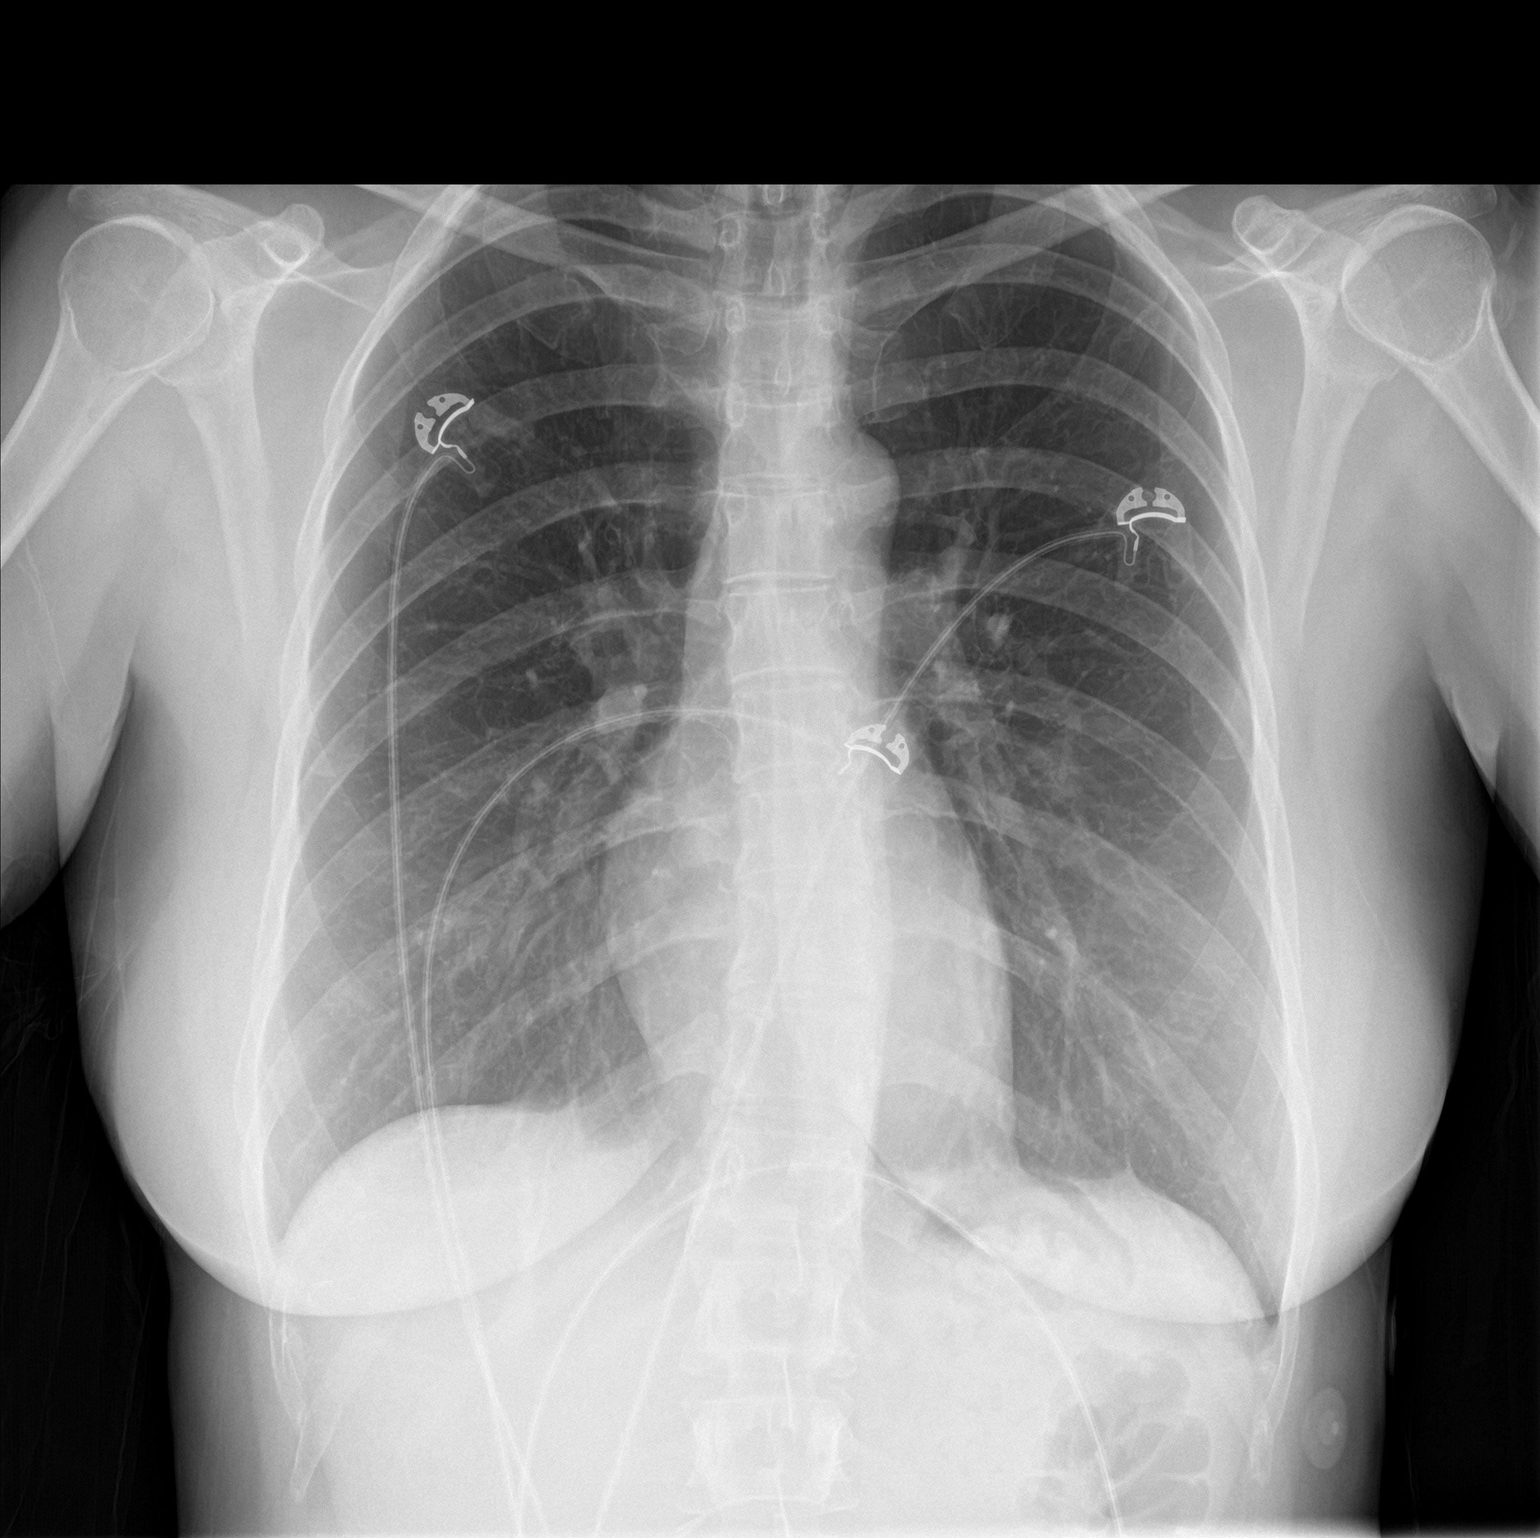

[chest lat]
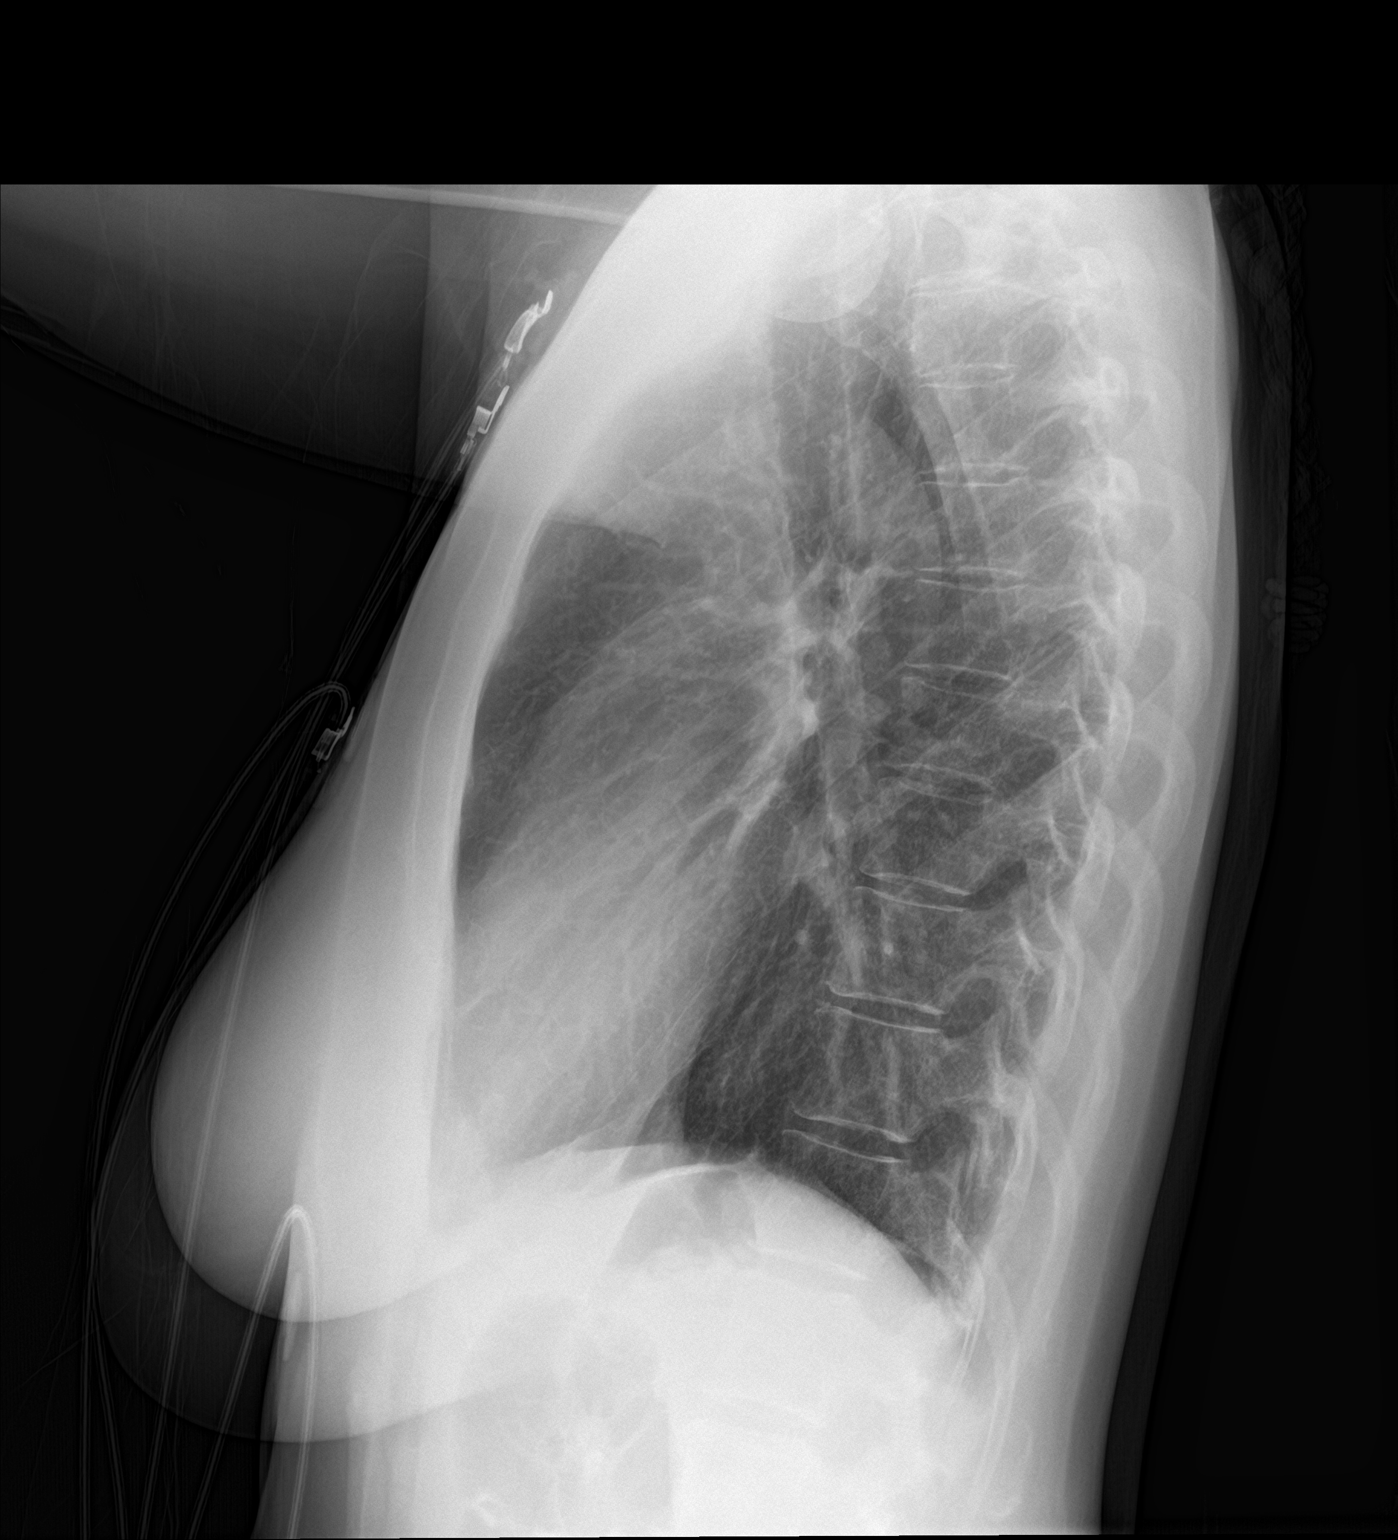

[2 of 2 positions shown; findings below may reference images not displayed]

FINDINGS: Minimal linear scarring or atelectasis in the left base. The lungs
are otherwise clear. The pulmonary vasculature is normal. There is
no pleural effusion. Hilar, mediastinal and cardiac contours are
normal.
IMPRESSION: Minimal linear scarring or atelectasis in the left base.

## 2018-09-01 ENCOUNTER — Encounter (INDEPENDENT_AMBULATORY_CARE_PROVIDER_SITE_OTHER): Payer: Self-pay

## 2018-09-01 ENCOUNTER — Ambulatory Visit (INDEPENDENT_AMBULATORY_CARE_PROVIDER_SITE_OTHER): Payer: Self-pay | Admitting: Gastroenterology
